# Patient Record
Sex: Male | Born: 1935 | Race: White | Hispanic: No | Marital: Married | Smoking: Former smoker
Health system: Southern US, Community
[De-identification: ages and names within clinical notes are randomized; demographics above are authoritative.]

## PROBLEM LIST (undated history)

## (undated) DIAGNOSIS — G4730 Sleep apnea, unspecified: Secondary | ICD-10-CM

## (undated) DIAGNOSIS — I10 Essential (primary) hypertension: Secondary | ICD-10-CM

## (undated) HISTORY — PX: TONSILLECTOMY: SUR1361

## (undated) HISTORY — PX: JOINT REPLACEMENT: SHX530

## (undated) HISTORY — PX: NOSE SURGERY: SHX723

---

## 2008-09-30 ENCOUNTER — Inpatient Hospital Stay (HOSPITAL_COMMUNITY): Admission: EM | Admit: 2008-09-30 | Discharge: 2008-10-02 | Payer: Self-pay | Admitting: Diagnostic Radiology

## 2010-04-27 LAB — BODY FLUID CULTURE

## 2010-04-27 LAB — CULTURE, BLOOD (ROUTINE X 2)

## 2010-04-27 LAB — BASIC METABOLIC PANEL
CO2: 26 mEq/L (ref 19–32)
Calcium: 8.4 mg/dL (ref 8.4–10.5)
Creatinine, Ser: 1.04 mg/dL (ref 0.4–1.5)
GFR calc Af Amer: >60 mL/min (ref >60)

## 2010-04-27 LAB — CBC
MCHC: 34.1 g/dL (ref 30.0–36.0)
MCV: 93.4 fL (ref 78.0–100.0)
Platelets: 201 10*3/uL (ref 150–400)
RDW: 12.5 % (ref 11.5–15.5)

## 2010-04-27 LAB — ANAEROBIC CULTURE: Gram Stain: NONE SEEN

## 2015-05-02 DIAGNOSIS — G4700 Insomnia, unspecified: Secondary | ICD-10-CM | POA: Insufficient documentation

## 2015-05-02 DIAGNOSIS — M109 Gout, unspecified: Secondary | ICD-10-CM | POA: Insufficient documentation

## 2015-05-02 DIAGNOSIS — Z79899 Other long term (current) drug therapy: Secondary | ICD-10-CM | POA: Insufficient documentation

## 2015-05-02 DIAGNOSIS — E119 Type 2 diabetes mellitus without complications: Secondary | ICD-10-CM | POA: Insufficient documentation

## 2015-05-02 DIAGNOSIS — M5416 Radiculopathy, lumbar region: Secondary | ICD-10-CM | POA: Insufficient documentation

## 2015-05-02 DIAGNOSIS — E782 Mixed hyperlipidemia: Secondary | ICD-10-CM | POA: Insufficient documentation

## 2015-05-02 DIAGNOSIS — I10 Essential (primary) hypertension: Secondary | ICD-10-CM | POA: Insufficient documentation

## 2015-05-02 DIAGNOSIS — N400 Enlarged prostate without lower urinary tract symptoms: Secondary | ICD-10-CM | POA: Insufficient documentation

## 2015-06-16 DIAGNOSIS — I714 Abdominal aortic aneurysm, without rupture, unspecified: Secondary | ICD-10-CM | POA: Insufficient documentation

## 2015-06-16 DIAGNOSIS — M48061 Spinal stenosis, lumbar region without neurogenic claudication: Secondary | ICD-10-CM | POA: Insufficient documentation

## 2015-06-26 ENCOUNTER — Other Ambulatory Visit: Payer: Self-pay | Admitting: Specialist

## 2015-06-26 DIAGNOSIS — R202 Paresthesia of skin: Secondary | ICD-10-CM

## 2015-07-07 ENCOUNTER — Ambulatory Visit
Admission: RE | Admit: 2015-07-07 | Discharge: 2015-07-07 | Disposition: A | Discharge status: Home / Self Care | Payer: Medicare Other | Source: Ambulatory Visit | Attending: Specialist | Admitting: Specialist

## 2015-07-07 DIAGNOSIS — R202 Paresthesia of skin: Secondary | ICD-10-CM

## 2015-07-07 MED ORDER — IOPAMIDOL (ISOVUE-M 200) INJECTION 41%
18.0000 mL | Freq: Once | INTRAMUSCULAR | Status: AC
  Administered 2015-07-07: 18 mL via INTRATHECAL

## 2015-07-07 MED ORDER — DIAZEPAM 5 MG PO TABS
5.0000 mg | ORAL_TABLET | Freq: Once | ORAL | Status: AC
  Administered 2015-07-07: 5 mg via ORAL

## 2015-07-07 NOTE — Discharge Instructions (Signed)

## 2015-08-15 ENCOUNTER — Ambulatory Visit: Payer: Self-pay | Admitting: Orthopedic Surgery

## 2015-08-24 ENCOUNTER — Ambulatory Visit: Payer: Self-pay | Admitting: Orthopedic Surgery

## 2015-08-24 NOTE — H&P (Signed)
[name] [name] is an 80 y.o. male.   Chief Complaint: back pain, bilateral leg pain and numbness HPI: The patient is a 80 year old male who presents today for follow up of their back. The patient is being followed for their central back pain and lt leg numbness, bil feet numbness. Symptoms reported today include: pain, numbness, leg pain, foot pain and pain with standing. The patient states that they are doing well. Current treatment includes: Gabapentin. The patient reports their current pain level to be mild to moderate. The patient presents today following CT scan and myelogram. The patient has reported improvement of their symptoms with: conservative measures. Note for "Follow-up back": He states he doesn't have a lot of pain, he describes it as discomfort.  [name] [name] follows up with his CT myelogram and EMG and nerve conduction study. Continues to report pain into the buttock, both legs and into the bottom and top of his feet. This challenges walking as well as playing golf. Overall, he indicates that it affects his level of activity in terms of walking and hiking.  No past medical history on file.  No past surgical history on file.  No family history on file. Social History:  reports that he quit smoking about 30 years ago. His smoking use included Cigarettes. He has a 25.00 pack-year smoking history. He has never used smokeless tobacco. His alcohol and drug histories are not on file.  Allergies: No Known Allergies   (Not in a hospital admission)  No results found for this or any previous visit (from the past 48 hour(s)). No results found.  Review of Systems  Constitutional: Negative.   HENT: Negative.   Eyes: Negative.   Respiratory: Negative.   Cardiovascular: Negative.   Gastrointestinal: Negative.   Genitourinary: Negative.   Musculoskeletal: Positive for back pain.  Skin: Negative.   Neurological: Positive for sensory change and focal weakness.  Endo/Heme/Allergies:  Negative.   Psychiatric/Behavioral: Negative.     There were no vitals taken for this visit. Physical Exam  Constitutional: He is oriented to person, place, and time. He appears well-developed.  HENT:  Head: Normocephalic.  Eyes: Pupils are equal, round, and reactive to light.  Neck: Normal range of motion.  Cardiovascular: Normal rate.   Respiratory: Effort normal.  GI: Soft.  Musculoskeletal:  On exam, he is upright, in mild distress. Mood and affect is appropriate. EHL is 4+/5 bilaterally in dorsiflexion. Perhaps some slightly altered sensation in the plantar aspect of the feet. Limited extension, improved with forward flexion.  Lumbar spine exam reveals no evidence of soft tissue swelling, deformity or skin ecchymosis. On palpation there is no tenderness of the lumbar spine. No flank pain with percussion. The abdomen is soft and nontender. Nontender over the trochanters. No cellulitis or lymphadenopathy.  Straight leg raise is negative. Motor is 5/5 including tibialis anterior, plantar flexion, quadriceps and hamstrings. Patient is normoreflexic. There is no Babinski or clonus. Sensory exam is intact to light touch. Patient has good distal pulses. No DVT. No pain and normal range of motion without instability of the hips, knees and ankles.  Neurological: He is alert and oriented to person, place, and time.  Skin: Skin is warm and dry.    His outside MRI demonstrates moderate lateral recess stenosis at L4-5, left greater than right. Disc degeneration at L5-S1.  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  His CT myelogram indicates significant  moderately severe lateral recess stenosis at 4-5 and an extruded disc herniation at 4-5 cephalad to the right.  Assessment/Plan 1. Bilateral lower extremity radicular pain secondary to moderate to severe spinal stenosis at 4-5 with a disc herniation at  4-5 extruded by CT myelogram. 2. He has peripheral neuropathy, etiology indeterminate.  We had a long discussion concerning his current pathology, relevant anatomy, and treatment options. One option is to live with his current symptoms, avoid extension, favor flexion. He reports at this point that that is unacceptable. He did have temporary relief epidurals at L4-5, suggesting that his symptoms are related both to his spinal stenosis and perhaps some component to his peripheral neuropathy. We discussed the latter subsequently. Most commonly, I have indicated to Mr. Waymack a peripheral neuropathy secondary to diabetes. He reports he checks his blood sugars periodically, more recently, it was 123 in the morning. Certainly, he may be a borderline and it will then be appropriate prior to proceeding with surgical intervention further explore that A1c, fasting glucose and perhaps if present, a treatment strategy accordingly. I did indicate that perioperatively control of blood glucose reduces the overall risk for infection. He understands that. He will make appointment with Dr. Shary Decamp for further discussion. Also, the common forms, vitamin B12, folate and thyroid function, he reports added with diet. In terms of surgical decompression, we discussed decompression at 4-5 with foraminotomies and microdiscectomy. I had an extensive discussion of the risks and benefits of the lumbar decompression with the patient including bleeding, infection, damage to neurovascular structures, epidural fibrosis, CSF leak requiring repair. We also discussed increase in pain, adjacent segment disease, recurrent disc herniation, need for future surgery including repeat decompression and/or fusion. We also discussed risks of postoperative hematoma, paralysis, anesthetic complications including DVT, PE, death, cardiopulmonary dysfunction. In addition, the perioperative and postoperative courses were discussed in detail including the  rehabilitative time and return to functional activity and work. I provided the patient with an illustrated handout and utilized the appropriate surgical models and specifically following multiple epidurals, the increased possibility of a CSF leak. We discussed possible patch, grafting, etc. In addition, this procedure is indicated for removing the dynamic compression of the nerve roots, which is noted in his upright extended CT myelogram and removal of the disc herniation. We discussed specifically the possibility of recurrent disc herniation, residual back pain related to disc degeneration if it is not addressed with the surgical procedure. I think continued need to appropriate body mechanics, core strengthening, etc. Overnight in the hospital, daily ambulation, sutures out in two weeks, six weeks of recovery and followed by six weeks of a conditioning-type program. We used a model and a power point presentation for illustration and specifically indicating that the component of his current symptoms particularly are related to his peripheral neuropathy. We will proceed with surgical scheduling. He has reported no history of MRSA and DVT. He is able to take aspirin. He is not allergic to penicillin.  Dorothy Spark., PA-C for Dr. Shelle Iron 08/24/2015, 1:30 PM

## 2015-08-30 NOTE — Patient Instructions (Addendum)
[name] [name]  08/30/2015   Your procedure is scheduled on: 09/13/15  Report to [facility]  Entrance take [facility]  elevators to 3rd floor to  Short Stay Center at 5:30 AM.  Call this number if you have problems the morning of surgery [phone]   Remember: ONLY 1 PERSON MAY GO WITH YOU TO SHORT STAY TO GET  READY MORNING OF YOUR SURGERY.  Do not eat food or drink liquids :After Midnight      Take these medicines the morning of surgery with A SIP OF WATER: none                                You may not have any metal on your body .              Do not wear jewelry.                Do not bring valuables to the hospital. [facility] IS NOT             RESPONSIBLE   FOR VALUABLES.  Contacts, dentures or bridgework may not be worn into surgery.  Leave suitcase in the car. After surgery it may be brought to your room.     _____________________________________________________________________             [facility] - Preparing for Surgery Before surgery, you can play an important role.  Because skin is not sterile, your skin needs to be as free of germs as possible.  You can reduce the number of germs on your skin by washing with CHG (chlorahexidine gluconate) soap before surgery.  CHG is an antiseptic cleaner which kills germs and bonds with the skin to continue killing germs even after washing. Please DO NOT use if you have an allergy to CHG or antibacterial soaps.  If your skin becomes reddened/irritated stop using the CHG and inform your nurse when you arrive at Short Stay. Do not shave (including legs and underarms) for at least 48 hours prior to the first CHG shower.  You may shave your face/neck. Please follow these instructions carefully:  1.  Shower with CHG Soap the night before surgery and the  morning of Surgery.  2.  If you choose to wash your hair, wash your hair first as usual with your  normal  shampoo.  3.  After you shampoo, rinse your hair  and body thoroughly to remove the  shampoo.                           4.  Use CHG as you would any other liquid soap.  You can apply chg directly  to the skin and wash                       Gently with a scrungie or clean washcloth.  5.  Apply the CHG Soap to your body ONLY FROM THE NECK DOWN.   Do not use on face/ open                           Wound or open sores. Avoid contact with eyes, ears mouth and genitals (private parts).  Wash face,  Genitals (private parts) with your normal soap.             6.  Wash thoroughly, paying special attention to the area where your surgery  will be performed.  7.  Thoroughly rinse your body with warm water from the neck down.  8.  DO NOT shower/wash with your normal soap after using and rinsing off  the CHG Soap.                9.  Pat yourself dry with a clean towel.            10.  Wear clean pajamas.            11.  Place clean sheets on your bed the night of your first shower and do not  sleep with pets. Day of Surgery : Do not apply any lotions/deodorants the morning of surgery.  Please wear clean clothes to the hospital/surgery center.  FAILURE TO FOLLOW THESE INSTRUCTIONS MAY RESULT IN THE CANCELLATION OF YOUR SURGERY PATIENT SIGNATURE_________________________________  NURSE SIGNATURE__________________________________  ________________________________________________________________________  WHAT IS A BLOOD TRANSFUSION? Blood Transfusion Information  A transfusion is the replacement of blood or some of its parts. Blood is made up of multiple cells which provide different functions.  Red blood cells carry oxygen and are used for blood loss replacement.  White blood cells fight against infection.  Platelets control bleeding.  Plasma helps clot blood.  Other blood products are available for specialized needs, such as hemophilia or other clotting disorders. BEFORE THE TRANSFUSION  Who gives blood for transfusions?    Healthy volunteers who are fully evaluated to make sure their blood is safe. This is blood bank blood. Transfusion therapy is the safest it has ever been in the practice of medicine. Before blood is taken from a donor, a complete history is taken to make sure that person has no history of diseases nor engages in risky social behavior (examples are intravenous drug use or sexual activity with multiple partners). The donor's travel history is screened to minimize risk of transmitting infections, such as malaria. The donated blood is tested for signs of infectious diseases, such as HIV and hepatitis. The blood is then tested to be sure it is compatible with you in order to minimize the chance of a transfusion reaction. If you or a relative donates blood, this is often done in anticipation of surgery and is not appropriate for emergency situations. It takes many days to process the donated blood. RISKS AND COMPLICATIONS Although transfusion therapy is very safe and saves many lives, the main dangers of transfusion include:   Getting an infectious disease.  Developing a transfusion reaction. This is an allergic reaction to something in the blood you were given. Every precaution is taken to prevent this. The decision to have a blood transfusion has been considered carefully by your caregiver before blood is given. Blood is not given unless the benefits outweigh the risks. AFTER THE TRANSFUSION  Right after receiving a blood transfusion, you will usually feel much better and more energetic. This is especially true if your red blood cells have gotten low (anemic). The transfusion raises the level of the red blood cells which carry oxygen, and this usually causes an energy increase.  The nurse administering the transfusion will monitor you carefully for complications. HOME CARE INSTRUCTIONS  No special instructions are needed after a transfusion. You may find your energy is better. Speak with your  caregiver about any  limitations on activity for underlying diseases you may have. SEEK MEDICAL CARE IF:   Your condition is not improving after your transfusion.  You develop redness or irritation at the intravenous (IV) site. SEEK IMMEDIATE MEDICAL CARE IF:  Any of the following symptoms occur over the next 12 hours:  Shaking chills.  You have a temperature by mouth above 102 F (38.9 C), not controlled by medicine.  Chest, back, or muscle pain.  People around you feel you are not acting correctly or are confused.  Shortness of breath or difficulty breathing.  Dizziness and fainting.  You get a rash or develop hives.  You have a decrease in urine output.  Your urine turns a dark color or changes to pink, red, or brown. Any of the following symptoms occur over the next 10 days:  You have a temperature by mouth above 102 F (38.9 C), not controlled by medicine.  Shortness of breath.  Weakness after normal activity.  The white part of the eye turns yellow (jaundice).  You have a decrease in the amount of urine or are urinating less often.  Your urine turns a dark color or changes to pink, red, or brown. Document Released: 01/05/2000 Document Revised: 04/01/2011 Document Reviewed: 08/24/2007 ExitCare Patient Information 2014 [place], [place].  _______________________________________________________________________  Incentive Spirometer  An incentive spirometer is a tool that can help keep your lungs clear and active. This tool measures how well you are filling your lungs with each breath. Taking long deep breaths may help reverse or decrease the chance of developing breathing (pulmonary) problems (especially infection) following:  A long period of time when you are unable to move or be active. BEFORE THE PROCEDURE   If the spirometer includes an indicator to show your best effort, your nurse or respiratory therapist will set it to a desired goal.  If possible, sit  up straight or lean slightly forward. Try not to slouch.  Hold the incentive spirometer in an upright position. INSTRUCTIONS FOR USE  1. Sit on the edge of your bed if possible, or sit up as far as you can in bed or on a chair. 2. Hold the incentive spirometer in an upright position. 3. Breathe out normally. 4. Place the mouthpiece in your mouth and seal your lips tightly around it. 5. Breathe in slowly and as deeply as possible, raising the piston or the ball toward the top of the column. 6. Hold your breath for 3-5 seconds or for as long as possible. Allow the piston or ball to fall to the bottom of the column. 7. Remove the mouthpiece from your mouth and breathe out normally. 8. Rest for a few seconds and repeat Steps 1 through 7 at least 10 times every 1-2 hours when you are awake. Take your time and take a few normal breaths between deep breaths. 9. The spirometer may include an indicator to show your best effort. Use the indicator as a goal to work toward during each repetition. 10. After each set of 10 deep breaths, practice coughing to be sure your lungs are clear. If you have an incision (the cut made at the time of surgery), support your incision when coughing by placing a pillow or rolled up towels firmly against it. Once you are able to get out of bed, walk around indoors and cough well. You may stop using the incentive spirometer when instructed by your caregiver.  RISKS AND COMPLICATIONS  Take your time so you do not get dizzy or  light-headed.  If you are in pain, you may need to take or ask for pain medication before doing incentive spirometry. It is harder to take a deep breath if you are having pain. AFTER USE  Rest and breathe slowly and easily.  It can be helpful to keep track of a log of your progress. Your caregiver can provide you with a simple table to help with this. If you are using the spirometer at home, follow these instructions: SEEK MEDICAL CARE IF:   You are  having difficultly using the spirometer.  You have trouble using the spirometer as often as instructed.  Your pain medication is not giving enough relief while using the spirometer.  You develop fever of 100.5 F (38.1 C) or higher. SEEK IMMEDIATE MEDICAL CARE IF:   You cough up bloody sputum that had not been present before.  You develop fever of 102 F (38.9 C) or greater.  You develop worsening pain at or near the incision site. MAKE SURE YOU:   Understand these instructions.  Will watch your condition.  Will get help right away if you are not doing well or get worse. Document Released: 05/20/2006 Document Revised: 04/01/2011 Document Reviewed: 07/21/2006 [facility] Patient Information 2014 [place], [place].   ________________________________________________________________________

## 2015-09-06 ENCOUNTER — Ambulatory Visit (HOSPITAL_COMMUNITY)
Admission: RE | Admit: 2015-09-06 | Discharge: 2015-09-06 | Disposition: A | Discharge status: Home / Self Care | Payer: Medicare Other | Source: Ambulatory Visit | Attending: Orthopedic Surgery | Admitting: Orthopedic Surgery

## 2015-09-06 ENCOUNTER — Encounter (HOSPITAL_COMMUNITY)
Admission: RE | Admit: 2015-09-06 | Discharge: 2015-09-06 | Disposition: A | Discharge status: Home / Self Care | Payer: Medicare Other | Source: Ambulatory Visit | Attending: Specialist | Admitting: Specialist

## 2015-09-06 ENCOUNTER — Encounter (HOSPITAL_COMMUNITY): Payer: Self-pay

## 2015-09-06 DIAGNOSIS — M4806 Spinal stenosis, lumbar region: Secondary | ICD-10-CM | POA: Insufficient documentation

## 2015-09-06 DIAGNOSIS — Z01818 Encounter for other preprocedural examination: Secondary | ICD-10-CM | POA: Diagnosis not present

## 2015-09-06 DIAGNOSIS — M4800 Spinal stenosis, site unspecified: Secondary | ICD-10-CM

## 2015-09-06 DIAGNOSIS — E119 Type 2 diabetes mellitus without complications: Secondary | ICD-10-CM | POA: Diagnosis not present

## 2015-09-06 DIAGNOSIS — I700 Atherosclerosis of aorta: Secondary | ICD-10-CM | POA: Insufficient documentation

## 2015-09-06 HISTORY — DX: Essential (primary) hypertension: I10

## 2015-09-06 HISTORY — DX: Sleep apnea, unspecified: G47.30

## 2015-09-06 LAB — BASIC METABOLIC PANEL
ANION GAP: 6 (ref 5–15)
BUN: 26 mg/dL — ABNORMAL HIGH (ref 6–20)
CALCIUM: 9.5 mg/dL (ref 8.9–10.3)
CO2: 27 mmol/L (ref 22–32)
CREATININE: 1.15 mg/dL (ref 0.61–1.24)
Chloride: 105 mmol/L (ref 101–111)
GFR, EST NON AFRICAN AMERICAN: 59 mL/min — AB (ref >60)
GLUCOSE: 122 mg/dL — AB (ref 65–99)
Potassium: 4.9 mmol/L (ref 3.5–5.1)
Sodium: 138 mmol/L (ref 135–145)

## 2015-09-06 LAB — SURGICAL PCR SCREEN
MRSA, PCR: NEGATIVE
Staphylococcus aureus: NEGATIVE

## 2015-09-06 LAB — CBC
HCT: 45.8 % (ref 39.0–52.0)
HEMOGLOBIN: 15.5 g/dL (ref 13.0–17.0)
MCH: 31.4 pg (ref 26.0–34.0)
MCHC: 33.8 g/dL (ref 30.0–36.0)
MCV: 92.7 fL (ref 78.0–100.0)
PLATELETS: 200 10*3/uL (ref 150–400)
RBC: 4.94 MIL/uL (ref 4.22–5.81)
RDW: 12.7 % (ref 11.5–15.5)
WBC: 9.5 10*3/uL (ref 4.0–10.5)

## 2015-09-06 LAB — ABO/RH: ABO/RH(D): A POS

## 2015-09-06 NOTE — Pre-Procedure Instructions (Signed)
BMP results routed to Dr. Jene EveryJeffrey Beane.

## 2015-09-07 LAB — HEMOGLOBIN A1C
HEMOGLOBIN A1C: 6.4 % — AB (ref 4.8–5.6)
Mean Plasma Glucose: 137 mg/dL

## 2015-09-12 NOTE — Anesthesia Preprocedure Evaluation (Addendum)
Anesthesia Evaluation  Patient identified by MRN, date of birth, ID band Patient awake    Reviewed: Allergy & Precautions, H&P , NPO status , Patient's Chart, lab work & pertinent test results  Airway Mallampati: IV  TM Distance: >3 FB Neck ROM: Full    Dental no notable dental hx. (+) Teeth Intact, Dental Advisory Given   Pulmonary neg pulmonary ROS, former smoker,    Pulmonary exam normal breath sounds clear to auscultation       Cardiovascular hypertension, Pt. on medications negative cardio ROS   Rhythm:Regular Rate:Normal     Neuro/Psych negative neurological ROS  negative psych ROS   GI/Hepatic negative GI ROS, Neg liver ROS,   Endo/Other  negative endocrine ROS  Renal/GU negative Renal ROS  negative genitourinary   Musculoskeletal   Abdominal   Peds  Hematology negative hematology ROS (+)   Anesthesia Other Findings   Reproductive/Obstetrics negative OB ROS                            Anesthesia Physical Anesthesia Plan  ASA: II  Anesthesia Plan: General   Post-op Pain Management:    Induction: Intravenous  Airway Management Planned: Oral ETT and Video Laryngoscope Planned  Additional Equipment:   Intra-op Plan:   Post-operative Plan: Extubation in OR  Informed Consent: I have reviewed the patients History and Physical, chart, labs and discussed the procedure including the risks, benefits and alternatives for the proposed anesthesia with the patient or authorized representative who has indicated his/her understanding and acceptance.   Dental advisory given  Plan Discussed with: CRNA  Anesthesia Plan Comments:        Anesthesia Quick Evaluation

## 2015-09-13 ENCOUNTER — Ambulatory Visit (HOSPITAL_COMMUNITY)
Admission: RE | Admit: 2015-09-13 | Discharge: 2015-09-13 | Disposition: A | Discharge status: Home / Self Care | Payer: Medicare Other | Source: Ambulatory Visit | Attending: Specialist | Admitting: Specialist

## 2015-09-13 ENCOUNTER — Ambulatory Visit (HOSPITAL_COMMUNITY): Payer: Medicare Other | Admitting: Anesthesiology

## 2015-09-13 ENCOUNTER — Encounter (HOSPITAL_COMMUNITY): Payer: Self-pay

## 2015-09-13 ENCOUNTER — Ambulatory Visit (HOSPITAL_COMMUNITY): Payer: Medicare Other

## 2015-09-13 ENCOUNTER — Encounter (HOSPITAL_COMMUNITY)
Admission: RE | Disposition: A | Discharge status: Home / Self Care | Payer: Self-pay | Source: Ambulatory Visit | Attending: Specialist

## 2015-09-13 DIAGNOSIS — Z87891 Personal history of nicotine dependence: Secondary | ICD-10-CM | POA: Insufficient documentation

## 2015-09-13 DIAGNOSIS — I10 Essential (primary) hypertension: Secondary | ICD-10-CM | POA: Insufficient documentation

## 2015-09-13 DIAGNOSIS — M48061 Spinal stenosis, lumbar region without neurogenic claudication: Secondary | ICD-10-CM

## 2015-09-13 DIAGNOSIS — Z419 Encounter for procedure for purposes other than remedying health state, unspecified: Secondary | ICD-10-CM

## 2015-09-13 DIAGNOSIS — M4806 Spinal stenosis, lumbar region: Secondary | ICD-10-CM | POA: Insufficient documentation

## 2015-09-13 DIAGNOSIS — E1142 Type 2 diabetes mellitus with diabetic polyneuropathy: Secondary | ICD-10-CM | POA: Insufficient documentation

## 2015-09-13 DIAGNOSIS — Z79899 Other long term (current) drug therapy: Secondary | ICD-10-CM | POA: Insufficient documentation

## 2015-09-13 HISTORY — PX: LUMBAR LAMINECTOMY/DECOMPRESSION MICRODISCECTOMY: SHX5026

## 2015-09-13 LAB — GLUCOSE, CAPILLARY: GLUCOSE-CAPILLARY: 145 mg/dL — AB (ref 65–99)

## 2015-09-13 LAB — TYPE AND SCREEN
ABO/RH(D): A POS
ANTIBODY SCREEN: NEGATIVE

## 2015-09-13 SURGERY — LUMBAR LAMINECTOMY/DECOMPRESSION MICRODISCECTOMY
Anesthesia: General | Site: Back

## 2015-09-13 MED ORDER — INSULIN ASPART 100 UNIT/ML ~~LOC~~ SOLN: 0.0000 [IU] | Freq: Three times a day (TID) | SUBCUTANEOUS | Status: DC

## 2015-09-13 MED ORDER — ACETAMINOPHEN 650 MG RE SUPP: 650.0000 mg | RECTAL | Status: DC | PRN

## 2015-09-13 MED ORDER — FENTANYL CITRATE (PF) 100 MCG/2ML IJ SOLN
INTRAMUSCULAR | Status: AC
  Filled 2015-09-13: qty 2

## 2015-09-13 MED ORDER — DEXTROSE 5 % IV SOLN
500.0000 mg | Freq: Four times a day (QID) | INTRAVENOUS | Status: DC | PRN
  Administered 2015-09-13: 500 mg via INTRAVENOUS
  Filled 2015-09-13: qty 5
  Filled 2015-09-13: qty 550

## 2015-09-13 MED ORDER — SODIUM CHLORIDE 0.9 % IR SOLN
Status: AC
  Filled 2015-09-13: qty 1

## 2015-09-13 MED ORDER — MENTHOL 3 MG MT LOZG: 1.0000 | LOZENGE | OROMUCOSAL | Status: DC | PRN

## 2015-09-13 MED ORDER — PHENYLEPHRINE HCL 10 MG/ML IJ SOLN
INTRAMUSCULAR | Status: DC | PRN
  Administered 2015-09-13 (×2): 80 ug via INTRAVENOUS
  Administered 2015-09-13: 40 ug via INTRAVENOUS

## 2015-09-13 MED ORDER — CEFAZOLIN SODIUM-DEXTROSE 2-4 GM/100ML-% IV SOLN
INTRAVENOUS | Status: AC
  Filled 2015-09-13: qty 100

## 2015-09-13 MED ORDER — ROCURONIUM BROMIDE 100 MG/10ML IV SOLN
INTRAVENOUS | Status: AC
  Filled 2015-09-13: qty 1

## 2015-09-13 MED ORDER — ROCURONIUM BROMIDE 100 MG/10ML IV SOLN
INTRAVENOUS | Status: DC | PRN
  Administered 2015-09-13: 10 mg via INTRAVENOUS
  Administered 2015-09-13: 40 mg via INTRAVENOUS

## 2015-09-13 MED ORDER — OXYCODONE-ACETAMINOPHEN 5-325 MG PO TABS: 1.0000 | ORAL_TABLET | ORAL | Status: DC | PRN

## 2015-09-13 MED ORDER — RISAQUAD PO CAPS: 1.0000 | ORAL_CAPSULE | Freq: Every day | ORAL | Status: DC

## 2015-09-13 MED ORDER — ALUM & MAG HYDROXIDE-SIMETH 200-200-20 MG/5ML PO SUSP: 30.0000 mL | Freq: Four times a day (QID) | ORAL | Status: DC | PRN

## 2015-09-13 MED ORDER — BUPIVACAINE-EPINEPHRINE 0.5% -1:200000 IJ SOLN
INTRAMUSCULAR | Status: DC | PRN
  Administered 2015-09-13: 12 mL

## 2015-09-13 MED ORDER — PROPOFOL 10 MG/ML IV BOLUS
INTRAVENOUS | Status: DC | PRN
  Administered 2015-09-13: 170 mg via INTRAVENOUS

## 2015-09-13 MED ORDER — POLYETHYLENE GLYCOL 3350 17 G PO PACK: 17.0000 g | PACK | Freq: Every day | ORAL | Status: DC | PRN

## 2015-09-13 MED ORDER — PHENYLEPHRINE 40 MCG/ML (10ML) SYRINGE FOR IV PUSH (FOR BLOOD PRESSURE SUPPORT)
PREFILLED_SYRINGE | INTRAVENOUS | Status: AC
  Filled 2015-09-13: qty 10

## 2015-09-13 MED ORDER — GABAPENTIN 300 MG PO CAPS
300.0000 mg | ORAL_CAPSULE | Freq: Three times a day (TID) | ORAL | Status: DC
  Administered 2015-09-13: 300 mg via ORAL
  Filled 2015-09-13: qty 1

## 2015-09-13 MED ORDER — ZOLPIDEM TARTRATE 5 MG PO TABS: 5.0000 mg | ORAL_TABLET | Freq: Every evening | ORAL | Status: DC | PRN

## 2015-09-13 MED ORDER — ONDANSETRON HCL 4 MG/2ML IJ SOLN
INTRAMUSCULAR | Status: DC | PRN
  Administered 2015-09-13: 4 mg via INTRAVENOUS

## 2015-09-13 MED ORDER — FINASTERIDE 5 MG PO TABS
5.0000 mg | ORAL_TABLET | Freq: Every day | ORAL | Status: DC
  Administered 2015-09-13: 5 mg via ORAL
  Filled 2015-09-13: qty 1

## 2015-09-13 MED ORDER — LACTATED RINGERS IV SOLN
INTRAVENOUS | Status: DC
  Administered 2015-09-13 (×2): via INTRAVENOUS

## 2015-09-13 MED ORDER — FENTANYL CITRATE (PF) 100 MCG/2ML IJ SOLN
INTRAMUSCULAR | Status: DC | PRN
  Administered 2015-09-13: 50 ug via INTRAVENOUS

## 2015-09-13 MED ORDER — MAGNESIUM CITRATE PO SOLN: 1.0000 | Freq: Once | ORAL | Status: DC | PRN

## 2015-09-13 MED ORDER — THROMBIN 5000 UNITS EX SOLR
CUTANEOUS | Status: AC
  Filled 2015-09-13: qty 10000

## 2015-09-13 MED ORDER — BISACODYL 5 MG PO TBEC: 5.0000 mg | DELAYED_RELEASE_TABLET | Freq: Every day | ORAL | Status: DC | PRN

## 2015-09-13 MED ORDER — DEXAMETHASONE SODIUM PHOSPHATE 10 MG/ML IJ SOLN
INTRAMUSCULAR | Status: DC | PRN
  Administered 2015-09-13: 10 mg via INTRAVENOUS

## 2015-09-13 MED ORDER — PHENOL 1.4 % MT LIQD
1.0000 | OROMUCOSAL | Status: DC | PRN
Start: 2015-09-13 — End: 2015-09-13
  Filled 2015-09-13: qty 177

## 2015-09-13 MED ORDER — DOCUSATE SODIUM 100 MG PO CAPS
100.0000 mg | ORAL_CAPSULE | Freq: Two times a day (BID) | ORAL | Status: DC
  Administered 2015-09-13: 100 mg via ORAL
  Filled 2015-09-13: qty 1

## 2015-09-13 MED ORDER — PROPOFOL 10 MG/ML IV BOLUS
INTRAVENOUS | Status: AC
  Filled 2015-09-13: qty 20

## 2015-09-13 MED ORDER — CEFAZOLIN SODIUM-DEXTROSE 2-4 GM/100ML-% IV SOLN
2.0000 g | INTRAVENOUS | Status: AC
  Administered 2015-09-13: 2 g via INTRAVENOUS
  Filled 2015-09-13: qty 100

## 2015-09-13 MED ORDER — HYDROCODONE-ACETAMINOPHEN 5-325 MG PO TABS: 1.0000 | ORAL_TABLET | ORAL | 0 refills | Status: AC | PRN

## 2015-09-13 MED ORDER — POLYMYXIN B SULFATE 500000 UNITS IJ SOLR
INTRAMUSCULAR | Status: DC | PRN
  Administered 2015-09-13: 500 mL

## 2015-09-13 MED ORDER — LIDOCAINE HCL (CARDIAC) 20 MG/ML IV SOLN
INTRAVENOUS | Status: AC
  Filled 2015-09-13: qty 5

## 2015-09-13 MED ORDER — SUGAMMADEX SODIUM 200 MG/2ML IV SOLN: INTRAVENOUS | Status: DC | PRN

## 2015-09-13 MED ORDER — ASPIRIN EC 81 MG PO TBEC: 81.0000 mg | DELAYED_RELEASE_TABLET | Freq: Every day | ORAL | Status: AC

## 2015-09-13 MED ORDER — SUGAMMADEX SODIUM 200 MG/2ML IV SOLN
INTRAVENOUS | Status: AC
Start: 2015-09-13 — End: 2015-09-13
  Filled 2015-09-13: qty 2

## 2015-09-13 MED ORDER — HYDROCODONE-ACETAMINOPHEN 5-325 MG PO TABS
1.0000 | ORAL_TABLET | ORAL | Status: DC | PRN
  Administered 2015-09-13: 1 via ORAL
  Filled 2015-09-13: qty 1

## 2015-09-13 MED ORDER — METHOCARBAMOL 500 MG PO TABS
500.0000 mg | ORAL_TABLET | Freq: Four times a day (QID) | ORAL | Status: DC | PRN
Start: 2015-09-13 — End: 2015-09-13

## 2015-09-13 MED ORDER — CEFAZOLIN SODIUM-DEXTROSE 2-4 GM/100ML-% IV SOLN
2.0000 g | Freq: Three times a day (TID) | INTRAVENOUS | Status: DC
  Administered 2015-09-13: 2 g via INTRAVENOUS
  Filled 2015-09-13: qty 100

## 2015-09-13 MED ORDER — DOCUSATE SODIUM 100 MG PO CAPS: 100.0000 mg | ORAL_CAPSULE | Freq: Two times a day (BID) | ORAL | 1 refills | Status: AC | PRN

## 2015-09-13 MED ORDER — ONDANSETRON HCL 4 MG/2ML IJ SOLN
INTRAMUSCULAR | Status: AC
  Filled 2015-09-13: qty 2

## 2015-09-13 MED ORDER — POTASSIUM CHLORIDE IN NACL 20-0.9 MEQ/L-% IV SOLN
INTRAVENOUS | Status: DC
  Filled 2015-09-13: qty 1000

## 2015-09-13 MED ORDER — POLYETHYLENE GLYCOL 3350 17 G PO PACK: 17.0000 g | PACK | Freq: Every day | ORAL | 0 refills | Status: AC

## 2015-09-13 MED ORDER — ONDANSETRON HCL 4 MG/2ML IJ SOLN: 4.0000 mg | INTRAMUSCULAR | Status: DC | PRN

## 2015-09-13 MED ORDER — ACETAMINOPHEN 325 MG PO TABS: 650.0000 mg | ORAL_TABLET | ORAL | Status: DC | PRN

## 2015-09-13 MED ORDER — LIDOCAINE HCL (CARDIAC) 20 MG/ML IV SOLN
INTRAVENOUS | Status: DC | PRN
  Administered 2015-09-13: 100 mg via INTRAVENOUS

## 2015-09-13 MED ORDER — BUPIVACAINE-EPINEPHRINE (PF) 0.5% -1:200000 IJ SOLN
INTRAMUSCULAR | Status: AC
Start: 2015-09-13 — End: 2015-09-13
  Filled 2015-09-13: qty 30

## 2015-09-13 MED ORDER — LOSARTAN POTASSIUM 50 MG PO TABS
100.0000 mg | ORAL_TABLET | Freq: Every day | ORAL | Status: DC
  Administered 2015-09-13: 100 mg via ORAL
  Filled 2015-09-13: qty 2

## 2015-09-13 MED ORDER — SUGAMMADEX SODIUM 200 MG/2ML IV SOLN
INTRAVENOUS | Status: DC | PRN
  Administered 2015-09-13: 200 mg via INTRAVENOUS

## 2015-09-13 MED ORDER — SUCCINYLCHOLINE CHLORIDE 20 MG/ML IJ SOLN
INTRAMUSCULAR | Status: DC | PRN
  Administered 2015-09-13: 100 mg via INTRAVENOUS

## 2015-09-13 MED ORDER — HYDROMORPHONE HCL 1 MG/ML IJ SOLN: 0.5000 mg | INTRAMUSCULAR | Status: DC | PRN

## 2015-09-13 MED ORDER — DEXAMETHASONE SODIUM PHOSPHATE 10 MG/ML IJ SOLN
INTRAMUSCULAR | Status: AC
  Filled 2015-09-13: qty 1

## 2015-09-13 SURGICAL SUPPLY — 44 items
BAG ZIPLOCK 12X15 (MISCELLANEOUS) IMPLANT
CLOSURE WOUND 1/2 X4 (GAUZE/BANDAGES/DRESSINGS) ×1
CLOTH 2% CHLOROHEXIDINE 3PK (PERSONAL CARE ITEMS) ×3 IMPLANT
DRAPE MICROSCOPE LEICA (MISCELLANEOUS) ×3 IMPLANT
DRAPE SHEET LG 3/4 BI-LAMINATE (DRAPES) IMPLANT
DRAPE SURG 17X11 SM STRL (DRAPES) ×3 IMPLANT
DRAPE UTILITY XL STRL (DRAPES) ×3 IMPLANT
DRSG AQUACEL AG ADV 3.5X 4 (GAUZE/BANDAGES/DRESSINGS) IMPLANT
DRSG AQUACEL AG ADV 3.5X 6 (GAUZE/BANDAGES/DRESSINGS) ×3 IMPLANT
DURAPREP 26ML APPLICATOR (WOUND CARE) ×3 IMPLANT
DURASEAL SPINE SEALANT 3ML (MISCELLANEOUS) IMPLANT
ELECT BLADE TIP CTD 4 INCH (ELECTRODE) ×3 IMPLANT
ELECT REM PT RETURN 9FT ADLT (ELECTROSURGICAL) ×3
ELECTRODE REM PT RTRN 9FT ADLT (ELECTROSURGICAL) ×1 IMPLANT
GLOVE BIOGEL PI IND STRL 7.0 (GLOVE) ×1 IMPLANT
GLOVE BIOGEL PI INDICATOR 7.0 (GLOVE) ×2
GLOVE SURG SS PI 7.0 STRL IVOR (GLOVE) ×3 IMPLANT
GLOVE SURG SS PI 7.5 STRL IVOR (GLOVE) ×3 IMPLANT
GLOVE SURG SS PI 8.0 STRL IVOR (GLOVE) ×6 IMPLANT
GOWN STRL REUS W/TWL XL LVL3 (GOWN DISPOSABLE) ×6 IMPLANT
HEMOSTAT SPONGE AVITENE ULTRA (HEMOSTASIS) ×3 IMPLANT
IV CATH 14GX2 1/4 (CATHETERS) IMPLANT
KIT BASIN OR (CUSTOM PROCEDURE TRAY) ×3 IMPLANT
KIT POSITIONING SURG ANDREWS (MISCELLANEOUS) ×3 IMPLANT
MANIFOLD NEPTUNE II (INSTRUMENTS) ×3 IMPLANT
NEEDLE SPNL 18GX3.5 QUINCKE PK (NEEDLE) ×6 IMPLANT
PACK LAMINECTOMY ORTHO (CUSTOM PROCEDURE TRAY) ×3 IMPLANT
PATTIES SURGICAL .5 X.5 (GAUZE/BANDAGES/DRESSINGS) IMPLANT
PATTIES SURGICAL .75X.75 (GAUZE/BANDAGES/DRESSINGS) ×3 IMPLANT
RUBBERBAND STERILE (MISCELLANEOUS) ×6 IMPLANT
SPONGE SURGIFOAM ABS GEL 100 (HEMOSTASIS) ×3 IMPLANT
STAPLER VISISTAT (STAPLE) IMPLANT
STRIP CLOSURE SKIN 1/2X4 (GAUZE/BANDAGES/DRESSINGS) ×2 IMPLANT
SUT NURALON 4 0 TR CR/8 (SUTURE) IMPLANT
SUT PROLENE 3 0 PS 2 (SUTURE) ×3 IMPLANT
SUT VIC AB 1 CT1 27 (SUTURE) ×2
SUT VIC AB 1 CT1 27XBRD ANTBC (SUTURE) ×1 IMPLANT
SUT VIC AB 1-0 CT2 27 (SUTURE) ×3 IMPLANT
SUT VIC AB 2-0 CT1 27 (SUTURE) ×2
SUT VIC AB 2-0 CT1 TAPERPNT 27 (SUTURE) ×1 IMPLANT
SUT VIC AB 2-0 CT2 27 (SUTURE) ×3 IMPLANT
SYR 3ML LL SCALE MARK (SYRINGE) IMPLANT
TOWEL OR 17X26 10 PK STRL BLUE (TOWEL DISPOSABLE) ×3 IMPLANT
YANKAUER SUCT BULB TIP NO VENT (SUCTIONS) ×3 IMPLANT

## 2015-09-13 NOTE — Anesthesia Postprocedure Evaluation (Signed)
Anesthesia Post Note  Patient: Shepard GeneralRandy Pflaum  Procedure(s) Performed: Procedure(s) (LRB): BILATERAL DECOMPRESSION L4 - L5 (N/A)  Patient location during evaluation: PACU Anesthesia Type: General Level of consciousness: awake and alert Pain management: pain level controlled Vital Signs Assessment: post-procedure vital signs reviewed and stable Respiratory status: spontaneous breathing, nonlabored ventilation and respiratory function stable Cardiovascular status: blood pressure returned to baseline and stable Postop Assessment: no signs of nausea or vomiting Anesthetic complications: no    Last Vitals:  Vitals:   09/13/15 1100 09/13/15 1115  BP: 135/71 129/71  Pulse: (!) 52 (!) 55  Resp: 16 16  Temp:      Last Pain:  Vitals:   09/13/15 0910  TempSrc:   PainSc: Asleep                 P[place]irie Stenberg,W. EDMOND

## 2015-09-13 NOTE — Evaluation (Signed)
Physical Therapy Evaluation Patient Details Name: [name] [name] MRN: [id number] DOB: 04-02-1935 Today's Date: 09/13/2015   History of Present Illness  BILATERAL DECOMPRESSION L4 - L5 (N/A)   Clinical Impression  The patient is mobilizing well with very mild drifting. Ambulated x 400' x 2. The patient is eager to DC today. Reviewed back precautions, ambulation after DC. No further needs.     Follow Up Recommendations No PT follow up    Equipment Recommendations  None recommended by PT    Recommendations for Other Services       Precautions / Restrictions Precautions Precautions: Back Precaution Booklet Issued: Yes (comment) Precaution Comments: reviewed precautions that are on the handout re: bed mobility, sleeping positions, dressing in sitting.  Restrictions Weight Bearing Restrictions: No      Mobility  Bed Mobility Overal bed mobility: Independent             [name] bed mobility comments: reviewed technique. Patient was siting upright in the bed so did not get to actually perform log roll. PT demonstrated to the patient.   Transfers Overall transfer level: Needs assistance Equipment used: None Transfers: Sit to/from Stand Sit to Stand: Independent            Ambulation/Gait Ambulation/Gait assistance: Min guard Ambulation Distance (Feet): 400 Feet (x 2) Assistive device: None Gait Pattern/deviations: Step-through pattern;Drifts right/left     [name] Gait Details: mild drifting, tended to occur when moving around objects in the hall. Recovers and did not require external support by therapist.  Stairs            Wheelchair Mobility    Modified Rankin (Stroke Patients Only)       Balance Overall balance assessment: Needs assistance         Standing balance support: No upper extremity supported;During functional activity Standing balance-Leahy Scale: Fair                               Pertinent Vitals/Pain Pain  Assessment: No/denies pain    Home Living Family/patient expects to be discharged to:: Private residence Living Arrangements: Spouse/significant other Available Help at Discharge: Family Type of Home: House Home Access: Stairs to enter   Secretary/administratorntrance Stairs-Number of Steps: 1 small Home Layout: One level Home Equipment: Cane - single point      Prior Function                 Hand Dominance        Extremity/Trunk Assessment   Upper Extremity Assessment: Overall WFL for tasks assessed           Lower Extremity Assessment: RLE deficits/detail;LLE deficits/detail RLE Deficits / Details: reports the sensation is improved in feet, still may feel a little numb LLE Deficits / Details: same as Rt  Cervical / Trunk Assessment: Normal  Communication      Cognition Arousal/Alertness: Awake/alert Behavior During Therapy: WFL for tasks assessed/performed Overall Cognitive Status: Within Functional Limits for tasks assessed                      [name] Comments      Exercises        Assessment/Plan    PT Assessment Patent does not need any further PT services  PT Diagnosis Difficulty walking   PT Problem List    PT Treatment Interventions     PT Goals (Current goals can be found in the Care Plan  section) Acute Rehab PT Goals Patient Stated Goal: to go home PT Goal Formulation: All assessment and education complete, DC therapy    Frequency     Barriers to discharge        Co-evaluation               End of Session   Activity Tolerance: Patient tolerated treatment well Patient left: in chair;with call bell/phone within reach;with family/visitor present;with nursing/sitter in room Nurse Communication: Mobility status    Functional Assessment Tool Used: clinical judgement Functional Limitation: Mobility: Walking and moving around Mobility: Walking and Moving Around Current Status ([id number](G8978): At least 1 percent but less than 20 percent impaired,  limited or restricted Mobility: Walking and Moving Around Goal Status [phone](G8979): At least 1 percent but less than 20 percent impaired, limited or restricted Mobility: Walking and Moving Around Discharge Status [phone](G8980): At least 1 percent but less than 20 percent impaired, limited or restricted    Time: 1443-1500 PT Time Calculation (min) (ACUTE ONLY): 17 min   Charges:   PT Evaluation $PT Eval Low Complexity: 1 Procedure     PT G Codes:   PT G-Codes **NOT FOR INPATIENT CLASS** Functional Assessment Tool Used: clinical judgement Functional Limitation: Mobility: Walking and moving around Mobility: Walking and Moving Around Current Status ([id number](G8978): At least 1 percent but less than 20 percent impaired, limited or restricted Mobility: Walking and Moving Around Goal Status [phone](G8979): At least 1 percent but less than 20 percent impaired, limited or restricted Mobility: Walking and Moving Around Discharge Status [phone](G8980): At least 1 percent but less than 20 percent impaired, limited or restricted    Rada HayHill, Naelle Diegel Elizabeth 09/13/2015, 3:19 PM Blanchard KelchKaren Victoire Deans PT [phone]

## 2015-09-13 NOTE — Progress Notes (Signed)
OT Cancellation Note  Patient Details Name: [name] [name] MRN: [id number] DOB: [birth year]   Cancelled Treatment:    Reason Eval/Treat Not Completed: OT screened, no needs identified, will sign off  Anet Logsdon M 09/13/2015, 3:33 PM

## 2015-09-13 NOTE — Discharge Instructions (Signed)

## 2015-09-13 NOTE — Discharge Summary (Signed)
Physician Discharge Summary   Patient ID: [name] [name] MRN: [id number] DOB/AGE: 1935-11-05 80 y.o.  Admit date: 09/13/2015 Discharge date: 09/13/2015  Primary Diagnosis:   spinal stenosis L4 - L5  Admission Diagnoses:  Past Medical History:  Diagnosis Date  . Hypertension   . Sleep apnea    resolved with surgery   Discharge Diagnoses:   Principal Problem:   Spinal stenosis of lumbar region  Procedure:  Procedure(s) (LRB): BILATERAL DECOMPRESSION L4 - L5 (N/A)   Consults: None  HPI:  see H&P    Laboratory Data: Hospital Outpatient Visit on 09/06/2015  Component Date Value Ref Range Status  . Hgb A1c MFr Bld 09/07/2015 6.4* 4.8 - 5.6 % Final   Comment: (NOTE)         Pre-diabetes: 5.7 - 6.4         Diabetes: >6.4         Glycemic control for adults with diabetes: <7.0   . Mean Plasma Glucose 09/07/2015 137  mg/dL Final   Comment: (NOTE) Performed At: [facility] [place], [place] [id number] Lindon Romp MD [id number]   . Sodium 09/06/2015 138  135 - 145 mmol/L Final  . Potassium 09/06/2015 4.9  3.5 - 5.1 mmol/L Final  . Chloride 09/06/2015 105  101 - 111 mmol/L Final  . CO2 09/06/2015 27  22 - 32 mmol/L Final  . Glucose, Bld 09/06/2015 122* 65 - 99 mg/dL Final  . BUN 09/06/2015 26* 6 - 20 mg/dL Final  . Creatinine, Ser 09/06/2015 1.15  0.61 - 1.24 mg/dL Final  . Calcium 09/06/2015 9.5  8.9 - 10.3 mg/dL Final  . GFR calc non Af Amer 09/06/2015 59* >60 mL/min Final  . GFR calc Af Amer 09/06/2015 >60  >60 mL/min Final   Comment: (NOTE) The eGFR has been calculated using the CKD EPI equation. This calculation has not been validated in all clinical situations. eGFR's persistently <60 mL/min signify possible Chronic Kidney Disease.   . Anion gap 09/06/2015 6  5 - 15 Final  . WBC 09/06/2015 9.5  4.0 - 10.5 K/uL Final  . RBC 09/06/2015 4.94  4.22 - 5.81 MIL/uL Final  . Hemoglobin 09/06/2015 15.5  13.0 - 17.0 g/dL Final  . HCT  09/06/2015 45.8  39.0 - 52.0 % Final  . MCV 09/06/2015 92.7  78.0 - 100.0 fL Final  . MCH 09/06/2015 31.4  26.0 - 34.0 pg Final  . MCHC 09/06/2015 33.8  30.0 - 36.0 g/dL Final  . RDW 09/06/2015 12.7  11.5 - 15.5 % Final  . Platelets 09/06/2015 200  150 - 400 K/uL Final  . ABO/RH(D) 09/13/2015 A POS   Final  . Antibody Screen 09/13/2015 NEG   Final  . Sample Expiration 09/13/2015 09/16/2015   Final  . Extend sample reason 09/13/2015 NO TRANSFUSIONS OR PREGNANCY IN THE PAST 3 MONTHS   Final  . ABO/RH(D) 09/06/2015 A POS   Final  . MRSA, PCR 09/06/2015 NEGATIVE  NEGATIVE Final  . Staphylococcus aureus 09/06/2015 NEGATIVE  NEGATIVE Final   Comment:        The Xpert SA Assay (FDA approved for NASAL specimens in patients over 89 years of age), is one component of a comprehensive surveillance program.  Test performance has been validated by [facility] for patients greater than or equal to 56 year old. It is not intended to diagnose infection nor to guide or monitor treatment.    No results for input(s): HGB in the last  72 hours. No results for input(s): WBC, RBC, HCT, PLT in the last 72 hours. No results for input(s): NA, K, CL, CO2, BUN, CREATININE, GLUCOSE, CALCIUM in the last 72 hours. No results for input(s): LABPT, INR in the last 72 hours.  X-Rays:Dg Lumbar Spine 2-3 Views  Addendum Date: 09/07/2015   ADDENDUM REPORT: 09/07/2015 12:45 ADDENDUM: Lumbar spine is numbered with the lowest segmented lumbar shaped vertebral lateral view as L5. Electronically Signed   By: Marcello Moores  Register   On: 09/07/2015 12:45   Result Date: 09/07/2015 CLINICAL DATA:  Lumbar spine surgery. EXAM: LUMBAR SPINE - 2-3 VIEW COMPARISON:  CT 07/07/2015 . FINDINGS: No acute bony abnormality identified. Normal alignment and mineralization. Aortoiliac atherosclerotic vascular calcification. IMPRESSION: 1.  No acute abnormality.  Degenerative changes lumbar spine. 2.  Aortoiliac atherosclerotic vascular disease.  Electronically Signed: ByMarcello Moores  Register On: 09/06/2015 11:03   Dg Spine Portable 1 View  Result Date: 09/13/2015 CLINICAL DATA:  Lumbar surgery . EXAM: PORTABLE SPINE - 1 VIEW COMPARISON:  09/06/2015. FINDINGS: Lumbar spine numbered as per prior lumbar spine series. Metallic marker noted posteriorly at L4-L5. Aortic atherosclerotic vascular calcification again noted. IMPRESSION: Metallic marker noted posteriorly at L4-L5. Electronically Signed   By: Marcello Moores  Register   On: 09/13/2015 08:45   Dg Spine Portable 1 View  Result Date: 09/13/2015 CLINICAL DATA:  Lumbar surgery. EXAM: PORTABLE SPINE - 1 VIEW COMPARISON:  CT myelogram from 07/07/2015. FINDINGS: 0758 hours. Portable cross-table lateral view of the lower lumbar spine. Using the same numbering scheme as on the previous CT scan, the lower most lumbar type vertebral body is labeled L5. Surgical retractors are noted in the soft tissues of lower back. Radiopaque surgical probe is positioned with the tip overlying a location just posterior to the L5 pedicle. IMPRESSION: Intraoperative localization. Electronically Signed   By: Misty Stanley M.D.   On: 09/13/2015 08:29   Dg Spine Portable 1 View  Result Date: 09/13/2015 CLINICAL DATA:  L4-5 laminectomy EXAM: PORTABLE SPINE - 1 VIEW COMPARISON:  September 06, 2015 FINDINGS: Cross-table lateral image labeled #1 submitted. There are metallic probes at the posterior aspects of the L3 and L4 spinous processes. There is moderate disc space narrowing at L4-5. There is mild disc space narrowing at L5-S1. No fracture or spondylolisthesis. There is atherosclerotic calcification in the aorta and common iliac arteries bilaterally. IMPRESSION: Metallic probe tips are at the posterior aspects of the L3 and L4 spinous processes respectively. 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 No fracture or spondylolisthesis. There is aortic atherosclerosis. Electronically Signed   By: Lowella Grip III M.D.   On:  09/13/2015 08:02    EKG:No orders found for this or any previous visit.   Hospital Course: Patient was admitted to [facility] and taken to the OR and underwent the above state procedure without complications.  Patient tolerated the procedure well and was later transferred to the recovery room and then to the orthopaedic floor for postoperative care.  They were given PO and IV analgesics for pain control following their surgery.  They were given 24 hours of postoperative antibiotics.   PT was consulted postop to assist with mobility and transfers.  The patient was allowed to be WBAT with therapy and was taught back precautions. Discharge planning was consulted to help with postop disposition and equipment needs.  Patient started to get up OOB with therapy same day of surgery and was ready to go home the same day of surgery. They  were given discharge instructions and dressing directions.  They were instructed on when to follow up in the office with Dr. Tonita Cong.   Diet: Regular diet Activity:WBAT; Lspine precautions Follow-up:in 10-14 days Disposition - Home Discharged Condition: good   Discharge Instructions    Call MD / Call 911    Complete by:  As directed   If you experience chest pain or shortness of breath, CALL 911 and be transported to the hospital emergency room.  If you develope a fever above 101 F, pus (white drainage) or increased drainage or redness at the wound, or calf pain, call your surgeon's office.   Constipation Prevention    Complete by:  As directed   Drink plenty of fluids.  Prune juice may be helpful.  You may use a stool softener, such as Colace (over the counter) 100 mg twice a day.  Use MiraLax (over the counter) for constipation as needed.   Diet - low sodium heart healthy    Complete by:  As directed   Increase activity slowly as tolerated    Complete by:  As directed       Medication List    TAKE these medications   aspirin EC 81 MG tablet Take 1 tablet  (81 mg total) by mouth daily. Resume 5 days post-op What changed:  additional instructions   docusate sodium 100 MG capsule Commonly known as:  COLACE Take 1 capsule (100 mg total) by mouth 2 (two) times daily as needed for mild constipation.   finasteride 5 MG tablet Commonly known as:  PROSCAR Take 5 mg by mouth.   gabapentin 300 MG capsule Commonly known as:  NEURONTIN Take 300 mg by mouth 3 (three) times daily.   HYDROcodone-acetaminophen 5-325 MG tablet Commonly known as:  NORCO/VICODIN Take 1-2 tablets by mouth every 4 (four) hours as needed for severe pain.   losartan 100 MG tablet Commonly known as:  COZAAR Take 100 mg by mouth daily.   polyethylene glycol packet Commonly known as:  MIRALAX / GLYCOLAX Take 17 g by mouth daily.   VITAMIN B 12 PO Take 1 tablet by mouth daily.   zolpidem 10 MG tablet Commonly known as:  AMBIEN Take 10 mg by mouth at bedtime as needed for sleep.      Follow-up Information    BEANE,JEFFREY C, MD Follow up in 2 week(s).   Specialty:  Orthopedic Surgery Contact information: [address] [place] [address] [phone]           Signed: Lacie Draft, PA-C for Dr. Tonita Cong Orthopaedic Surgery 09/13/2015, 1:41 PM

## 2015-09-13 NOTE — Progress Notes (Signed)
Oral airway removed upon arrival to pacu 

## 2015-09-13 NOTE — Op Note (Signed)
NAMNolon [name]:  [name], [name]                 ACCOUNT NO.:  [id number]  MEDICAL RECORD NO.:  [id number]  LOCATION:  [facility]                         FACILITY[facility]  PHYSICIAN:  [name] [name], M.D.    DATE OF BIRTH:  [birth year]  DATE OF PROCEDURE:  09/13/2015 DATE OF DISCHARGE:                              OPERATIVE REPORT   PREOPERATIVE DIAGNOSIS:  Spinal stenosis at L4-5 bilaterally.  POSTOPERATIVE DIAGNOSIS:  Spinal stenosis at L4-5 bilaterally.  PROCEDURES PERFORMED: 1. Bilateral microlumbar decompression, L4-5. 2. Foraminotomies, L4 and L5 bilaterally.  ANESTHESIA:  General.  ASSISTANT:  [name] [name].  HISTORY:  A 80, he has claudication symptoms, found to have neuropathy, but had significant lateral recess stenosis bilaterally at 4-5.  It was felt that he had a combination peripheral neuropathy as well as symptoms secondary to his lateral recess stenosis, diagnosed by MRI and myelogram.  He was indicated for microlumbar decompression, L4-5 bilaterally.  Risks and benefits were discussed including bleeding, infection, damage to neurovascular structures, no change in symptoms, worsening symptoms, DVT, PE, anesthetic complications, etc.  He does have diabetes, so we discussed the twofold effect on his neuropathy both from diabetes and from his stenosis.  TECHNIQUE:  With the patient in supine position after the induction of adequate general anesthesia, 2 g of Kefzol, placed prone on the [place] frame.  All bony prominences were well padded.  Lumbar region was prepped and draped in usual sterile fashion.  Two 18-gauge spinal needles were utilized to localize the L4-5 interspace, confirmed with x- ray.  Incision was made from spinous process of 4-5, subcutaneous tissue was dissected.  Electrocautery was utilized to achieve hemostasis. Dorsolumbar fascia identified, divided in line with skin incision, 0.25% Marcaine with epinephrine was used to infiltrate the  paraspinous musculature.  McCullough retractor was placed.  Confirmatory radiograph obtained at 4-5.  Operating microscope was draped and brought on the surgical field.  He had a very small interlaminar window and facet hypertrophy.  We used a Leksell rongeur to remove the portion of the inferior lamina of 4 bilaterally and a portion of the medial aspect of both facets at 4-5, approximately 10%, portion of the spinous process and interspinous ligament.  From both sides of the operating room table, we then proceeded with our bilateral hemilaminotomies using 2-3 mm Kerrison, first on the left to perform hemilaminotomy and to detach the ligamentum flavum.  Neural patty placed beneath the ligamentum flavum and severe stenosis was noted bilaterally at 4-5, compressed in the 5 root and extending into the foramen.  We then detached the ligamentum flavum from the cephalad edge of 5 utilizing a straight microcurette and protecting with neuro patties.  We decompressed the lateral recess and medial border of the pedicle and then performed a foraminotomy of 5, gently mobilized the 5 root medially, identified the disk which was bulging, no rupture was noted, cephalad or caudad as noted on the myelogram, behind the vertebral body of 4 and 5 and up to the pedicle of 4.  Foraminotomy of 4 and 5 was performed as well.  Following that, neural probe passed freely at the foramen.  There was good restoration of the thecal  sac on the left.  In addition, we then turned our attention to the right and performed foraminotomies of 4 and 5, then decompressed the lateral recess in a similar fashion, protecting the neural elements, evaluated for disk herniation.  Bulging disk with no rupture.  We checked beneath the thecal sac and both axials, shoulders of the nerve root and the neural foramen.  We extended the hemilaminotomy up to the pedicle of 4 and passed a neuro probe out the foramen of 4 and 5 without neural  compression.  There was 1 cm of excursion of the 5 root medial to pedicle without tension.  We obtained a confirmatory radiograph with markers in the foramen of 4 and 5 and at the disk space of 4-5.  Next, copiously irrigated the wound.  No evidence of CSF leakage or active bleeding.  Thrombin-soaked Gelfoam was placed in laminotomy defect.  We removed the [facility] retractor.  Paraspinous muscle was irrigated.  No active bleeding.  We reapproximated the dorsolumbar fascia with #1 Vicryl in interrupted figure-of-eight sutures, subcu with 2-0 and skin with Prolene.  Sterile dressing applied.  Placed supine on the hospital bed, extubated without difficulty, and transported to the recovery room in satisfactory condition.  The patient tolerated the procedure well.  No complications.  Assistant, [name] [name], [place].  Minimal blood loss.     [name] [name], M.D.     [name] [name]  D:  09/13/2015  T:  09/13/2015  Job:  [id number]

## 2015-09-13 NOTE — Transfer of Care (Signed)
Immediate Anesthesia Transfer of Care Note  Patient: Shepard GeneralRandy Krone  Procedure(s) Performed: Procedure(s): BILATERAL DECOMPRESSION L4 - L5 (N/A)  Patient Location: PACU  Anesthesia Type:General  Level of Consciousness: sedated  Airway & Oxygen Therapy: Patient Spontanous Breathing and Patient connected to face mask oxygen  Post-op Assessment: Report given to RN and Post -op Vital signs reviewed and stable  Post vital signs: Reviewed and stable  Last Vitals:  Vitals:   09/13/15 0543  BP: 140/76  Pulse: (!) 58  Resp: 16  Temp: 36.7 C    Last Pain:  Vitals:   09/13/15 0543  TempSrc: Oral         Complications: No apparent anesthesia complications

## 2015-09-13 NOTE — Anesthesia Procedure Notes (Signed)
Procedure Name: Intubation Date/Time: 09/13/2015 7:31 AM Performed by: Doran ClayALDAY, Saraia Platner R Pre-anesthesia Checklist: Patient identified, Suction available, Emergency Drugs available, Patient being monitored and Timeout performed Patient Re-evaluated:Patient Re-evaluated prior to inductionOxygen Delivery Method: Circle system utilized Preoxygenation: Pre-oxygenation with 100% oxygen Intubation Type: IV induction Ventilation: Mask ventilation without difficulty Laryngoscope Size: Mac and 4 Grade View: Grade II Tube type: Oral Tube size: 7.5 mm Number of attempts: 1 Airway Equipment and Method: Stylet Placement Confirmation: ETT inserted through vocal cords under direct vision,  positive ETCO2 and breath sounds checked- equal and bilateral Secured at: 23 cm Tube secured with: Tape Dental Injury: Teeth and Oropharynx as per pre-operative assessment

## 2015-09-13 NOTE — Brief Op Note (Signed)
09/13/2015  8:57 AM  PATIENT:  [name] [name]  80 y.o. male  PRE-OPERATIVE DIAGNOSIS:  spinal stenosis L4 - L5  POST-OPERATIVE DIAGNOSIS:  spinal stenosis L4 - L5  PROCEDURE:  Procedure(s): BILATERAL DECOMPRESSION L4 - L5 (N/A)  SURGEON:  Surgeon(s) and Role:    * Jene EveryJeffrey Tyliek Timberman, MD - Primary  PHYSICIAN ASSISTANT:   ASSISTANTS: Bissell   ANESTHESIA:   general  EBL:  Total I/O In: 1000 [I.V.:1000] Out: 250 [Urine:200; Blood:50]  BLOOD ADMINISTERED:none  DRAINS: none   LOCAL MEDICATIONS USED:  MARCAINE     SPECIMEN:  No Specimen  DISPOSITION OF SPECIMEN:  N/A  COUNTS:  YES  TOURNIQUET:  * No tourniquets in log *  DICTATION: .Other Dictation: Dictation Number [id number]  PLAN OF CARE: Admit for overnight observation  PATIENT DISPOSITION:  PACU - hemodynamically stable.   Delay start of Pharmacological VTE agent (>24hrs) due to surgical blood loss or risk of bleeding: yes

## 2018-02-28 IMAGING — DX DG SPINE 1V PORT
1 series · 1 of 1 positions shown · non-contrast
Comparison: 09/06/2015.

CLINICAL DATA: Lumbar surgery .

EXAM:
PORTABLE SPINE - 1 VIEW

[l-spine lat]
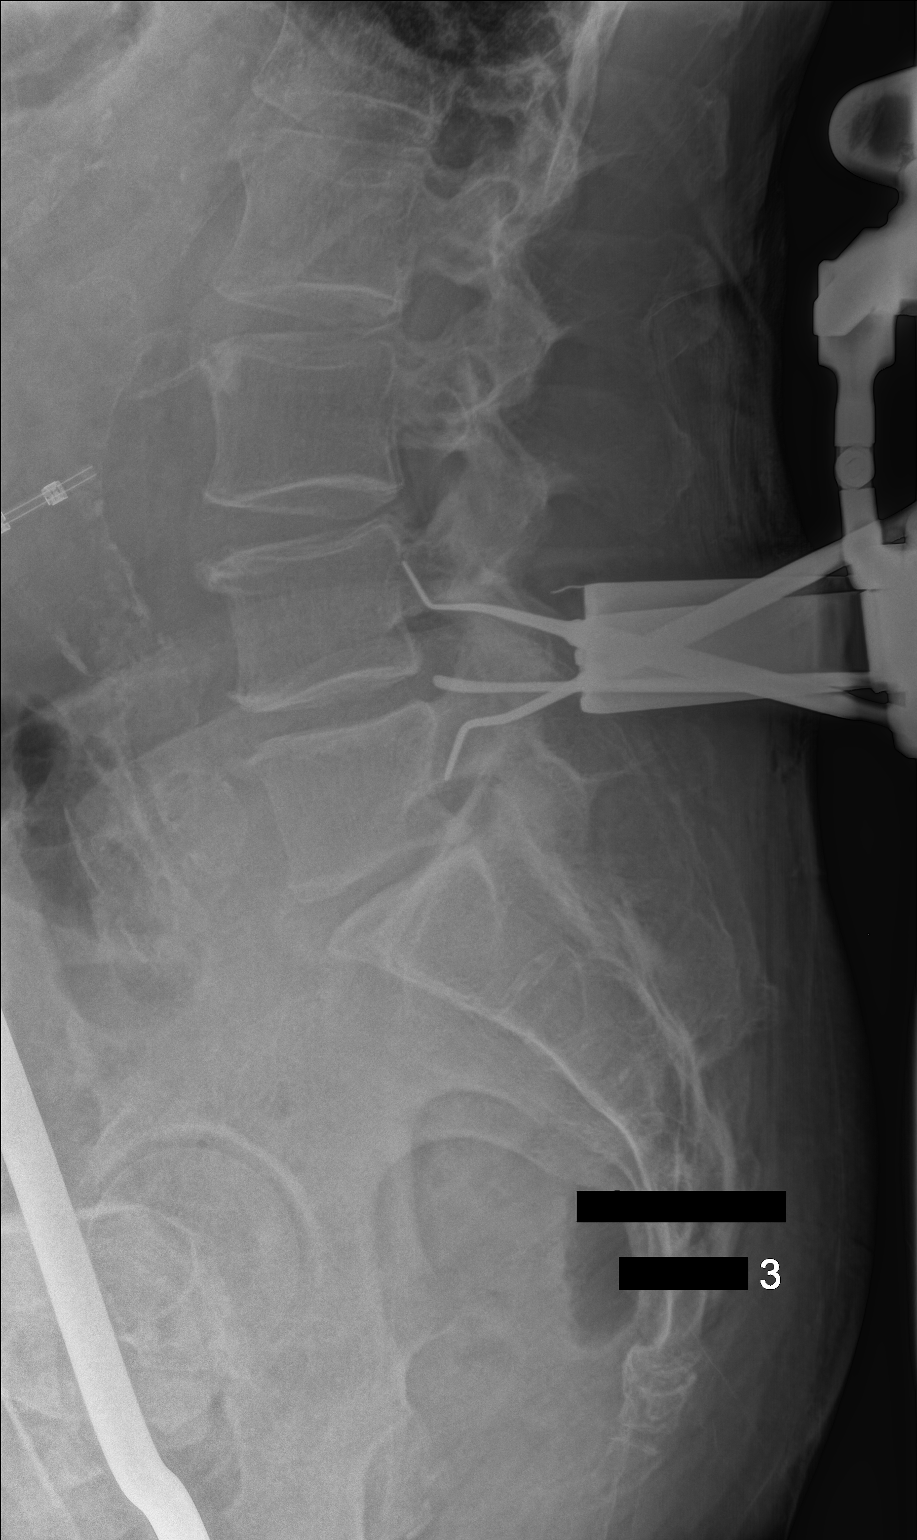

[1 of 1 positions shown; findings below may reference images not displayed]

FINDINGS: Lumbar spine numbered as per prior lumbar spine series. Metallic
marker noted posteriorly at L4-L5. Aortic atherosclerotic vascular
calcification again noted.
IMPRESSION: Metallic marker noted posteriorly at L4-L5.

## 2018-02-28 IMAGING — DX DG SPINE 1V PORT
1 series · 1 of 1 positions shown · non-contrast
Comparison: CT myelogram from 07/07/2015.

CLINICAL DATA: Lumbar surgery.

EXAM:
PORTABLE SPINE - 1 VIEW

[l-spine lat]
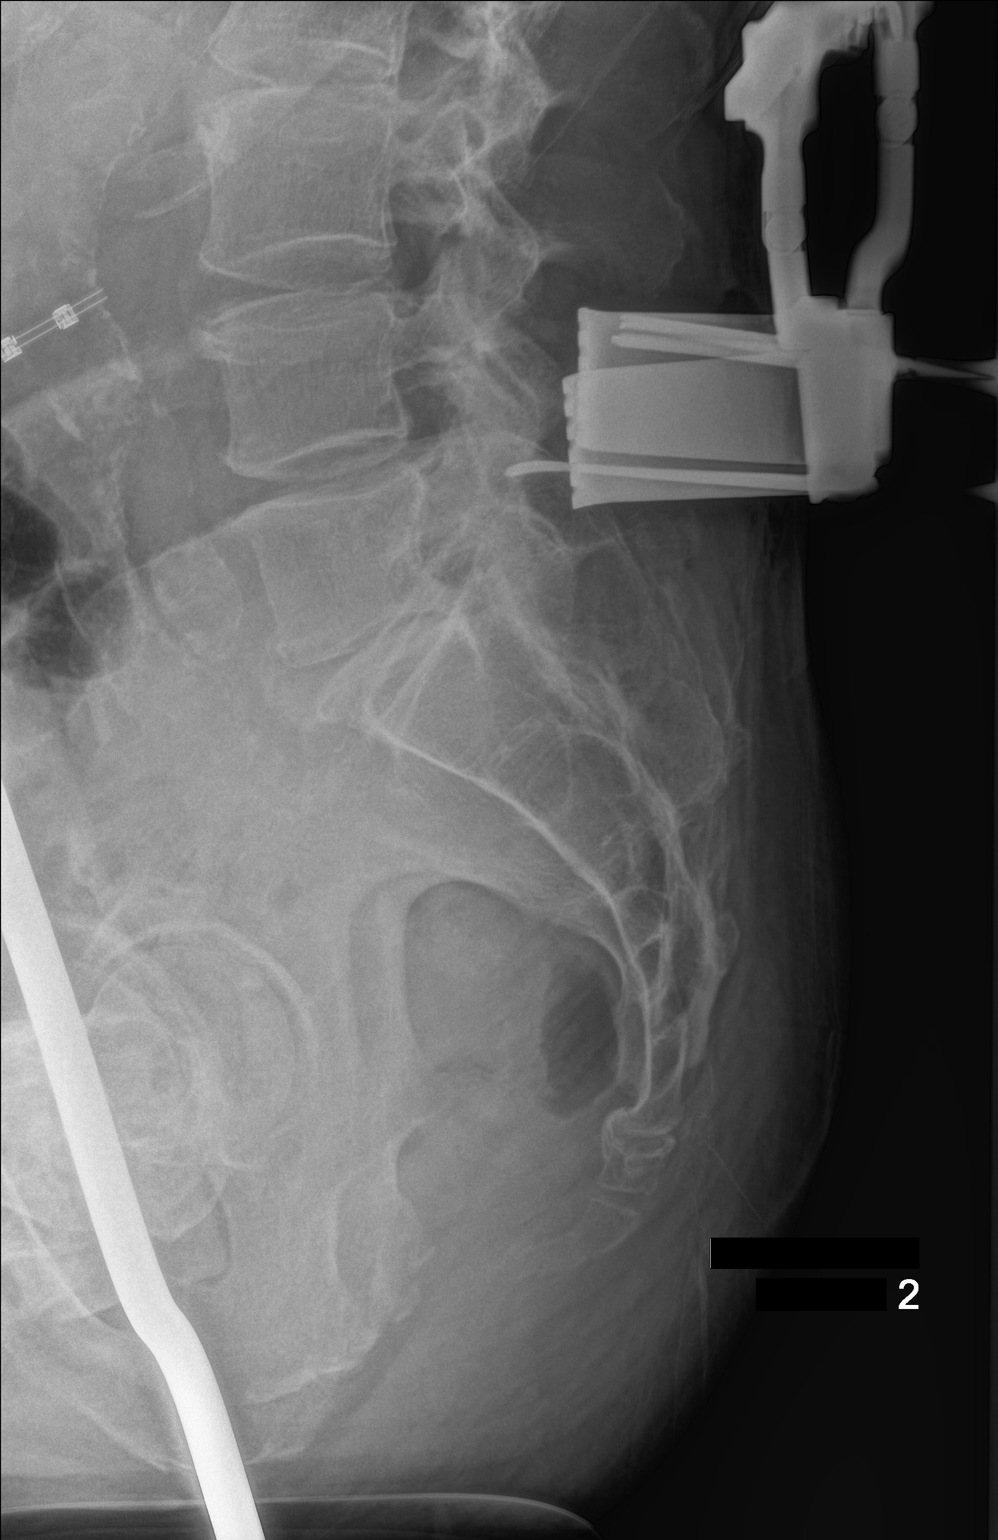

[1 of 1 positions shown; findings below may reference images not displayed]

FINDINGS: 1345 hours. Portable cross-table lateral view of the lower lumbar
spine. Using the same numbering scheme as on the previous CT scan,
the lower most lumbar type vertebral body is labeled L5. Surgical
retractors are noted in the soft tissues of lower back. Radiopaque
surgical probe is positioned with the tip overlying a location just
posterior to the L5 pedicle.
IMPRESSION: Intraoperative localization.
